# Patient Record
Sex: Female | Born: 1970 | Race: White | Hispanic: No | Marital: Married | Smoking: Never smoker
Health system: Southern US, Community
[De-identification: ages and names within clinical notes are randomized; demographics above are authoritative.]

## PROBLEM LIST (undated history)

## (undated) DIAGNOSIS — I10 Essential (primary) hypertension: Secondary | ICD-10-CM

## (undated) HISTORY — PX: NO PAST SURGERIES: SHX2092

---

## 2004-05-05 ENCOUNTER — Inpatient Hospital Stay: Payer: Self-pay | Admitting: Gynecology

## 2010-06-16 DIAGNOSIS — L719 Rosacea, unspecified: Secondary | ICD-10-CM | POA: Insufficient documentation

## 2010-06-16 DIAGNOSIS — E669 Obesity, unspecified: Secondary | ICD-10-CM | POA: Insufficient documentation

## 2010-12-19 ENCOUNTER — Ambulatory Visit: Payer: Self-pay

## 2012-04-30 ENCOUNTER — Ambulatory Visit: Payer: Self-pay

## 2012-04-30 LAB — RAPID INFLUENZA A&B ANTIGENS

## 2012-04-30 LAB — RAPID STREP-A WITH REFLX: Micro Text Report: POSITIVE

## 2012-07-05 ENCOUNTER — Ambulatory Visit: Payer: Self-pay

## 2013-07-08 DIAGNOSIS — I10 Essential (primary) hypertension: Secondary | ICD-10-CM | POA: Insufficient documentation

## 2013-07-31 ENCOUNTER — Ambulatory Visit: Payer: Self-pay | Admitting: Internal Medicine

## 2014-07-10 DIAGNOSIS — Z803 Family history of malignant neoplasm of breast: Secondary | ICD-10-CM | POA: Insufficient documentation

## 2015-01-22 ENCOUNTER — Encounter: Payer: Self-pay | Admitting: Emergency Medicine

## 2015-01-22 ENCOUNTER — Ambulatory Visit
Admission: EM | Admit: 2015-01-22 | Discharge: 2015-01-22 | Disposition: A | Discharge status: Home / Self Care | Payer: 59 | Attending: Family Medicine | Admitting: Family Medicine

## 2015-01-22 DIAGNOSIS — H6123 Impacted cerumen, bilateral: Secondary | ICD-10-CM | POA: Diagnosis not present

## 2015-01-22 DIAGNOSIS — H9202 Otalgia, left ear: Secondary | ICD-10-CM | POA: Diagnosis not present

## 2015-01-22 HISTORY — DX: Essential (primary) hypertension: I10

## 2015-01-22 NOTE — Discharge Instructions (Signed)
Cerumen Impaction The structures of the external ear canal secrete a waxy substance known as cerumen. Excess cerumen can build up in the ear canal, causing a condition known as cerumen impaction. Cerumen impaction can cause ear pain and disrupt the function of the ear. The rate of cerumen production differs for each individual. In certain individuals, the configuration of the ear canal may decrease his or her ability to naturally remove cerumen. CAUSES Cerumen impaction is caused by excessive cerumen production or buildup. RISK FACTORS  Frequent use of swabs to clean ears.  Having narrow ear canals.  Having eczema.  Being dehydrated. SIGNS AND SYMPTOMS  Diminished hearing.  Ear drainage.  Ear pain.  Ear itch. TREATMENT Treatment may involve:  Over-the-counter or prescription ear drops to soften the cerumen.  Removal of cerumen by a health care provider. This may be done with:  Irrigation with warm water. This is the most common method of removal.  Ear curettes and other instruments.  Surgery. This may be done in severe cases. HOME CARE INSTRUCTIONS  Take medicines only as directed by your health care provider.  Do not insert objects into the ear with the intent of cleaning the ear. PREVENTION  Do not insert objects into the ear, even with the intent of cleaning the ear. Removing cerumen as a part of normal hygiene is not necessary, and the use of swabs in the ear canal is not recommended.  Drink enough water to keep your urine clear or pale yellow.  Control your eczema if you have it. SEEK MEDICAL CARE IF:  You develop ear pain.  You develop bleeding from the ear.  The cerumen does not clear after you use ear drops as directed.   This information is not intended to replace advice given to you by your health care provider. Make sure you discuss any questions you have with your health care provider.   Document Released: 05/12/2004 Document Revised: 04/25/2014  Document Reviewed: 11/19/2014 Elsevier Interactive Patient Education 2016 Else[employer] Inc[employer]  Earache An earache, also called otalgia, can be caused by many things. Pain from an earache can be sharp, dull, or burning. The pain may be temporary or constant. Earaches can be caused by problems with the ear, such as infection in either the middle ear or the ear canal, injury, impacted ear wax, middle ear pressure, or a foreign body in the ear. Ear pain can also result from problems in other areas. This is called referred pain. For example, pain can come from a sore throat, a tooth infection, or problems with the jaw or the joint between the jaw and the skull (temporomandibular joint, or TMJ). The cause of an earache is not always easy to identify. Watchful waiting may be appropriate for some earaches until a clear cause of the pain can be found. HOME CARE INSTRUCTIONS Watch your condition for any changes. The following actions may help to lessen any discomfort that you are feeling:  Take medicines only as directed by your health care provider. This includes ear drops.  Apply ice to your outer ear to help reduce pain.  Put ice in a plastic bag.  Place a towel between your skin and the bag.  Leave the ice on for 20 minutes, 2-3 times per day.  Do not put anything in your ear other than medicine that is prescribed by your health care provider.  Try resting in an upright position instead of lying down. This may help to reduce pressure in the middle ear  and relieve pain.  Chew gum if it helps to relieve your ear pain.  Control any allergies that you have.  Keep all follow-up visits as directed by your health care provider. This is important. SEEK MEDICAL CARE IF:  Your pain does not improve within 2 days.  You have a fever.  You have new or worsening symptoms. SEEK IMMEDIATE MEDICAL CARE IF:  You have a severe headache.  You have a stiff neck.  You have difficulty swallowing.  You have  redness or swelling behind your ear.  You have drainage from your ear.  You have hearing loss.  You feel dizzy.   This information is not intended to replace advice given to you by your health care provider. Make sure you discuss any questions you have with your health care provider.   Document Released: 11/20/2003 Document Revised: 04/25/2014 Document Reviewed: 11/03/2013 Elsevier Interactive Patient Education [employer]

## 2015-01-22 NOTE — ED Notes (Signed)
Left ear pain since this morning

## 2015-01-22 NOTE — ED Provider Notes (Signed)
CSN: [id number]     Arrival date & time 01/22/15  1339 History   First MD Initiated Contact with Patient 01/22/15 1510     Chief Complaint  Patient presents with  . Otalgia   (Consider location/radiation/quality/duration/timing/severity/associated sxs/prior Treatment) Patient is a 44 y.o. female presenting with ear pain. The history is provided by the patient. No language interpreter was used.  Otalgia Location:  Left Quality:  Pressure and throbbing Severity:  Moderate Onset quality:  Sudden Duration:  6 hours Timing:  Constant Progression:  Worsening Context: elevation change   Context: not direct blow and not foreign body in ear   Relieved by:  Nothing Ineffective treatments:  None tried Associated symptoms: no congestion, no ear discharge, no fever and no sore throat   Associated symptoms comment:  She states she put a Q-tip in the left ear earlier today and the pain started after a Q-tip put in the ear. Risk factors: no recent travel, no chronic ear infection and no prior ear surgery     Past Medical History  Diagnosis Date  . Hypertension    Past Surgical History  Procedure Laterality Date  . No past surgeries     History reviewed. No pertinent family history. Social History  Substance Use Topics  . Smoking status: Never Smoker   . Smokeless tobacco: None  . Alcohol Use: No   OB History    No data available     Review of Systems  Constitutional: Negative for fever.  HENT: Positive for ear pain. Negative for congestion, ear discharge and sore throat.   All other systems reviewed and are negative.   Allergies  Review of patient's allergies indicates no known allergies.  Home Medications   Prior to Admission medications   Medication Sig Start Date End Date Taking? Authorizing Provider  amLODipine (NORVASC) 10 MG tablet Take 10 mg by mouth daily.   Yes Historical Provider, MD  hydrochlorothiazide (HYDRODIURIL) 25 MG tablet Take 25 mg by mouth daily.   Yes  Historical Provider, MD  metoprolol succinate (TOPROL-XL) 50 MG 24 hr tablet Take 50 mg by mouth 2 (two) times daily. Take with or immediately following a meal.   Yes Historical Provider, MD   Meds Ordered and Administered this Visit  Medications - No data to display  BP 141/89 mmHg  Pulse 71  Temp(Src) 98.4 F (36.9 C) (Tympanic)  Ht  (1.676 m)  Wt 188 lb (85.276 kg)  BMI 30.36 kg/m2  SpO2 100%  LMP 01/17/2015 No data found.   Physical Exam  Constitutional: She is oriented to person, place, and time. She appears well-nourished.  HENT:  Head: Normocephalic and atraumatic.  Right Ear: Hearing normal. A foreign body is present.  Left Ear: Hearing normal. A foreign body is present.  Nose: Nose normal.  Mouth/Throat: Uvula is midline and oropharynx is clear and moist.  Patient has marked amount wax in both ears. The right ear a little bit of the eardrum can be visualized in the left ear completely occluded with wax.  Eyes: Conjunctivae are normal. Pupils are equal, round, and reactive to light.  Neck: Neck supple.  Musculoskeletal: Normal range of motion.  Neurological: She is alert and oriented to person, place, and time.  Skin: Skin is warm.  Psychiatric: She has a normal mood and affect. Her behavior is normal.  Vitals reviewed.   ED Course  .Ear Cerumen Removal Date/Time: 01/22/2015 4:08 PM Performed by: Hassan Rowan Authorized by: Hassan Rowan Consent: Verbal  consent obtained. Local anesthetic: none Location details: left ear Procedure type: curette and irrigation Patient sedated: no Comments: After irrigation. Both eardrums were visible and patient felt much better.   (including critical care time)  Labs Review Labs Reviewed - No data to display  Imaging Review No results found.   Visual Acuity Review  Right Eye Distance:   Left Eye Distance:   Bilateral Distance:    Right Eye Near:   Left Eye Near:    Bilateral Near:         MDM   1.  Excessive ear wax, bilateral   2. Ear pain, left      Patient much improved after irrigation patient felt great both TMs now visible. Patient discharged home in good condition.  Hassan Rowan, MD 01/22/15 [phone]

## 2015-01-23 ENCOUNTER — Ambulatory Visit: Payer: Self-pay | Admitting: Physician Assistant

## 2015-07-07 DIAGNOSIS — L719 Rosacea, unspecified: Secondary | ICD-10-CM | POA: Diagnosis not present

## 2015-07-07 DIAGNOSIS — Z0000 Encounter for general adult medical examination without abnormal findings: Secondary | ICD-10-CM | POA: Diagnosis not present

## 2015-07-07 DIAGNOSIS — Z23 Encounter for immunization: Secondary | ICD-10-CM | POA: Diagnosis not present

## 2015-07-07 DIAGNOSIS — Z124 Encounter for screening for malignant neoplasm of cervix: Secondary | ICD-10-CM | POA: Diagnosis not present

## 2015-07-07 DIAGNOSIS — Z1239 Encounter for other screening for malignant neoplasm of breast: Secondary | ICD-10-CM | POA: Diagnosis not present

## 2015-07-07 DIAGNOSIS — I10 Essential (primary) hypertension: Secondary | ICD-10-CM | POA: Diagnosis not present

## 2016-06-28 DIAGNOSIS — Z0000 Encounter for general adult medical examination without abnormal findings: Secondary | ICD-10-CM | POA: Diagnosis not present

## 2016-06-28 DIAGNOSIS — Z1231 Encounter for screening mammogram for malignant neoplasm of breast: Secondary | ICD-10-CM | POA: Diagnosis not present

## 2016-06-28 DIAGNOSIS — H6123 Impacted cerumen, bilateral: Secondary | ICD-10-CM | POA: Diagnosis not present

## 2016-06-28 DIAGNOSIS — I10 Essential (primary) hypertension: Secondary | ICD-10-CM | POA: Diagnosis not present

## 2016-06-29 ENCOUNTER — Other Ambulatory Visit: Payer: Self-pay | Admitting: Internal Medicine

## 2016-06-29 DIAGNOSIS — Z1239 Encounter for other screening for malignant neoplasm of breast: Secondary | ICD-10-CM

## 2016-07-29 DIAGNOSIS — M545 Low back pain: Secondary | ICD-10-CM | POA: Diagnosis not present

## 2016-07-29 DIAGNOSIS — M6281 Muscle weakness (generalized): Secondary | ICD-10-CM | POA: Diagnosis not present

## 2016-08-10 DIAGNOSIS — M6281 Muscle weakness (generalized): Secondary | ICD-10-CM | POA: Diagnosis not present

## 2016-08-10 DIAGNOSIS — M545 Low back pain: Secondary | ICD-10-CM | POA: Diagnosis not present

## 2017-05-05 ENCOUNTER — Other Ambulatory Visit: Payer: Self-pay | Admitting: Internal Medicine

## 2017-05-05 ENCOUNTER — Ambulatory Visit
Admission: RE | Admit: 2017-05-05 | Discharge: 2017-05-05 | Disposition: A | Discharge status: Home / Self Care | Payer: 59 | Source: Ambulatory Visit | Attending: Internal Medicine | Admitting: Internal Medicine

## 2017-05-05 DIAGNOSIS — Z1239 Encounter for other screening for malignant neoplasm of breast: Secondary | ICD-10-CM

## 2017-05-05 DIAGNOSIS — Z1231 Encounter for screening mammogram for malignant neoplasm of breast: Secondary | ICD-10-CM | POA: Diagnosis not present

## 2017-06-30 DIAGNOSIS — E669 Obesity, unspecified: Secondary | ICD-10-CM | POA: Diagnosis not present

## 2017-06-30 DIAGNOSIS — Z13220 Encounter for screening for lipoid disorders: Secondary | ICD-10-CM | POA: Diagnosis not present

## 2017-06-30 DIAGNOSIS — Z131 Encounter for screening for diabetes mellitus: Secondary | ICD-10-CM | POA: Diagnosis not present

## 2017-06-30 DIAGNOSIS — I10 Essential (primary) hypertension: Secondary | ICD-10-CM | POA: Diagnosis not present

## 2017-06-30 DIAGNOSIS — H6123 Impacted cerumen, bilateral: Secondary | ICD-10-CM | POA: Diagnosis not present

## 2017-06-30 DIAGNOSIS — Z6831 Body mass index (BMI) 31.0-31.9, adult: Secondary | ICD-10-CM | POA: Diagnosis not present

## 2017-06-30 DIAGNOSIS — Z0000 Encounter for general adult medical examination without abnormal findings: Secondary | ICD-10-CM | POA: Diagnosis not present

## 2017-06-30 DIAGNOSIS — R5383 Other fatigue: Secondary | ICD-10-CM | POA: Diagnosis not present

## 2018-01-13 ENCOUNTER — Telehealth: Payer: 59 | Admitting: Physician Assistant

## 2018-01-13 DIAGNOSIS — R300 Dysuria: Secondary | ICD-10-CM | POA: Diagnosis not present

## 2018-01-13 MED ORDER — CEPHALEXIN 500 MG PO CAPS: 500.0000 mg | ORAL_CAPSULE | Freq: Two times a day (BID) | ORAL | 0 refills | Status: AC

## 2018-01-13 NOTE — Progress Notes (Signed)

## 2018-04-17 IMAGING — MG MM DIGITAL SCREENING BILAT W/ TOMO W/ CAD
8 of 13 series · 8 of 29 positions shown · non-contrast
Comparison: Previous exam(s).

CLINICAL DATA: Screening.

EXAM:
2D DIGITAL SCREENING BILATERAL MAMMOGRAM WITH 3D TOMO WITH CAD

[R MLO (1 of 2)]
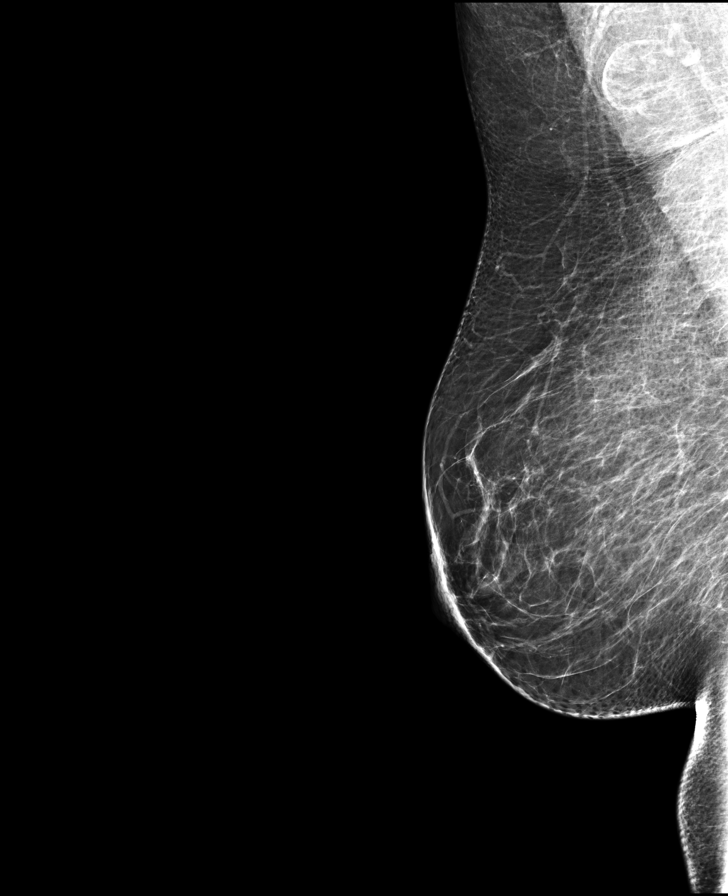

[R MLO (2 of 2)]
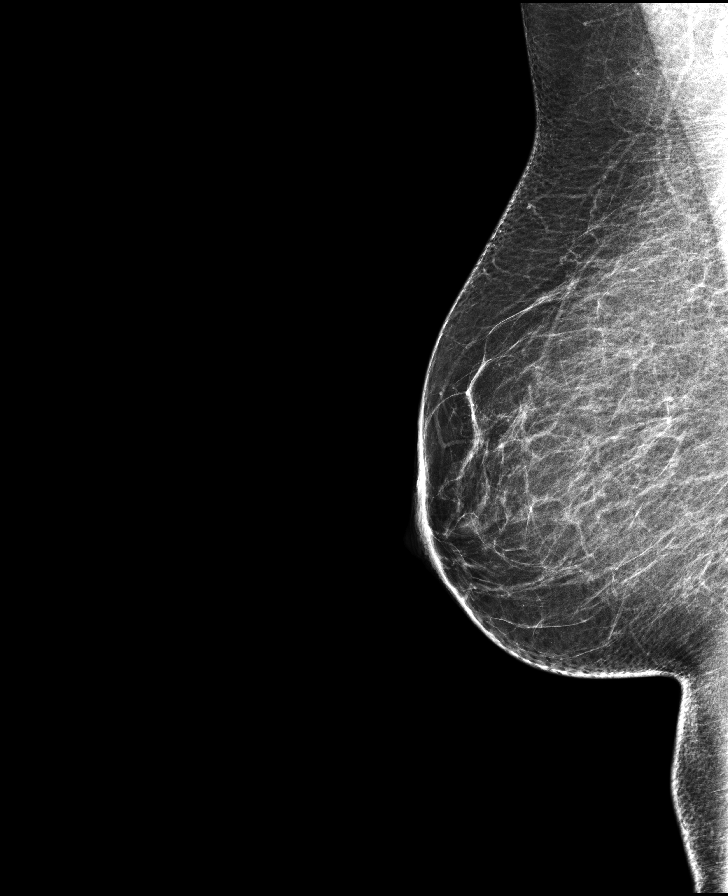

[L MLO synth-2D]
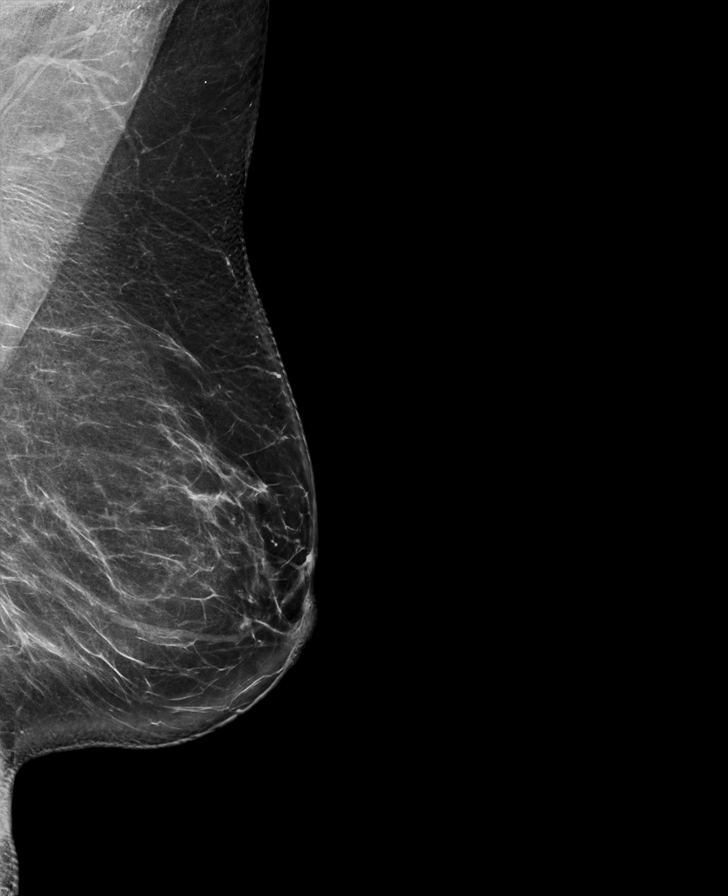

[R CC]
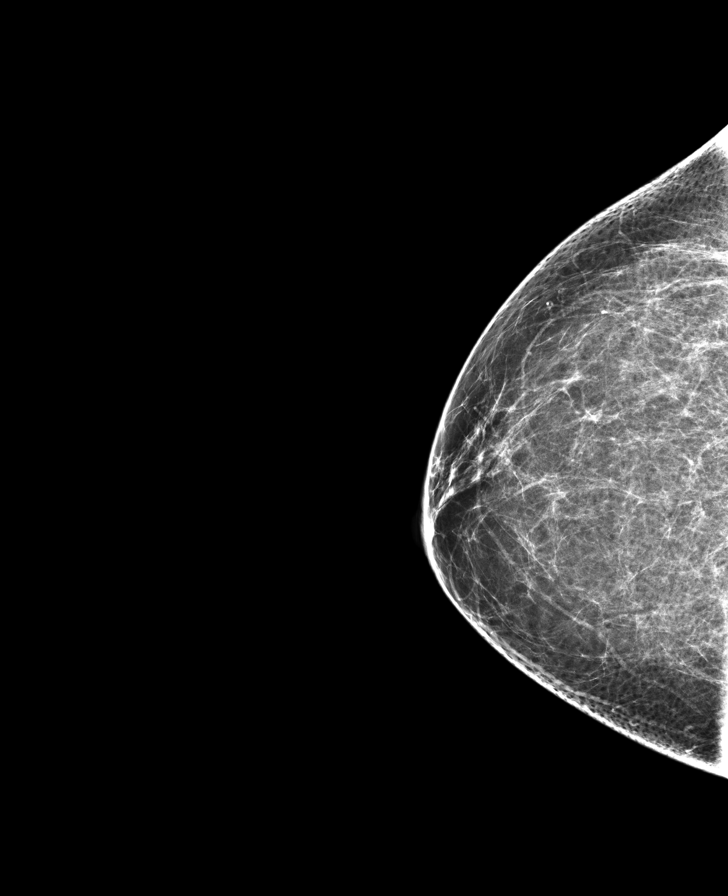

[R CC synth-2D]
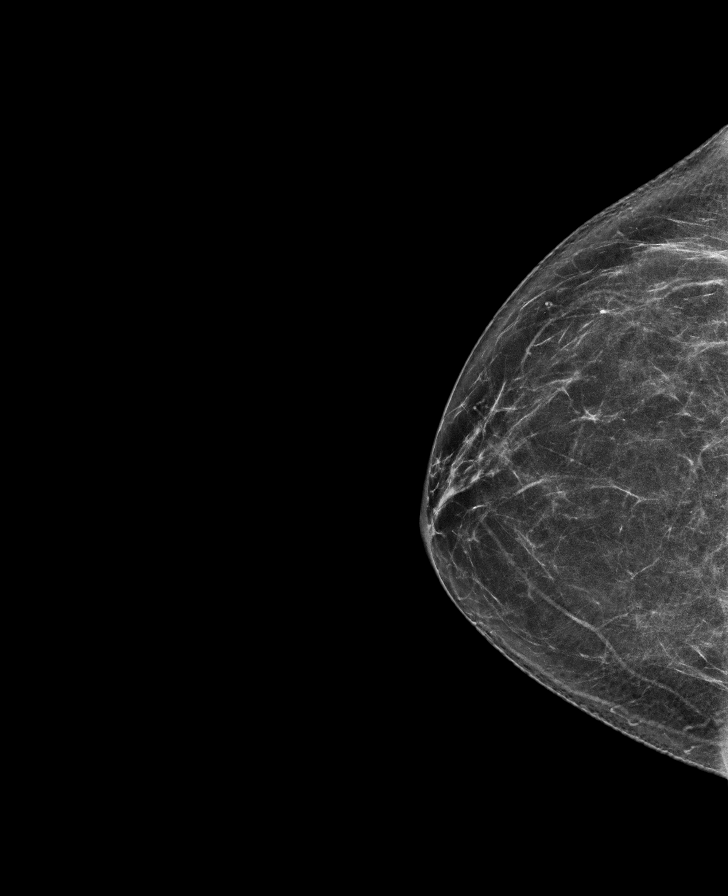

[L MLO]
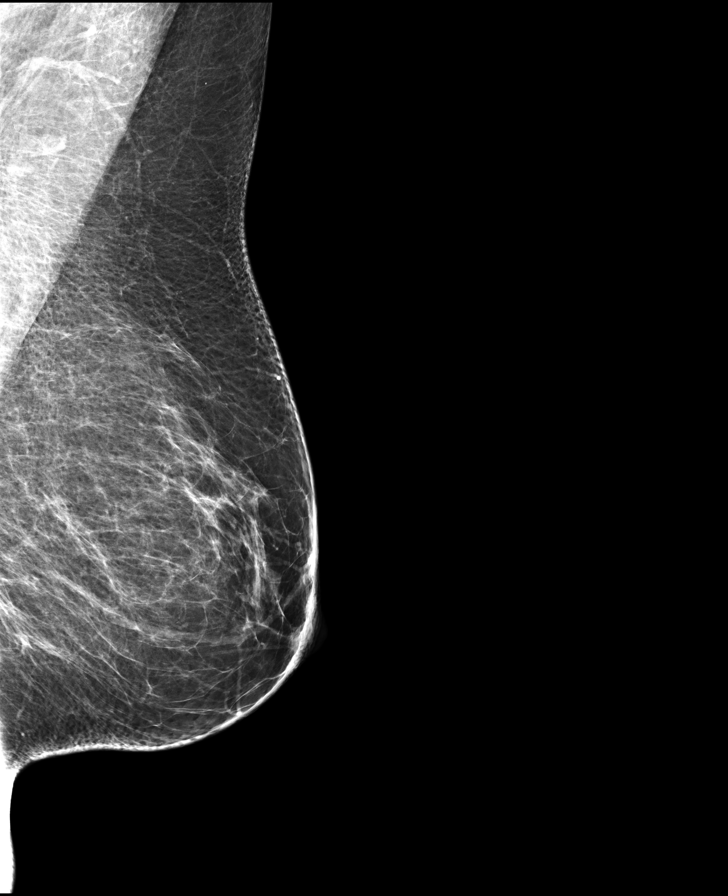

[R MLO synth-2D]
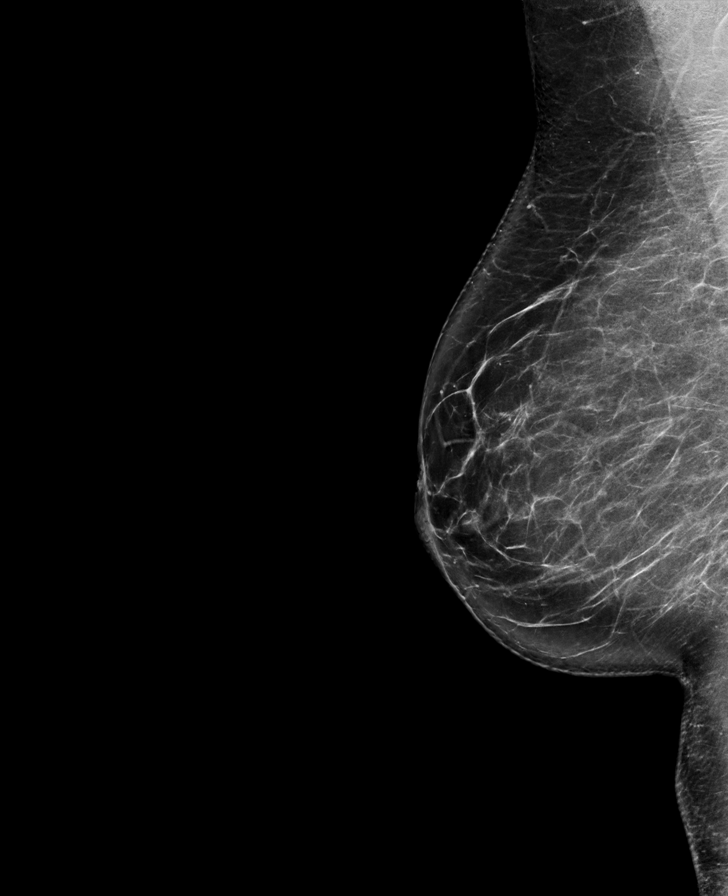

[L CC synth-2D]
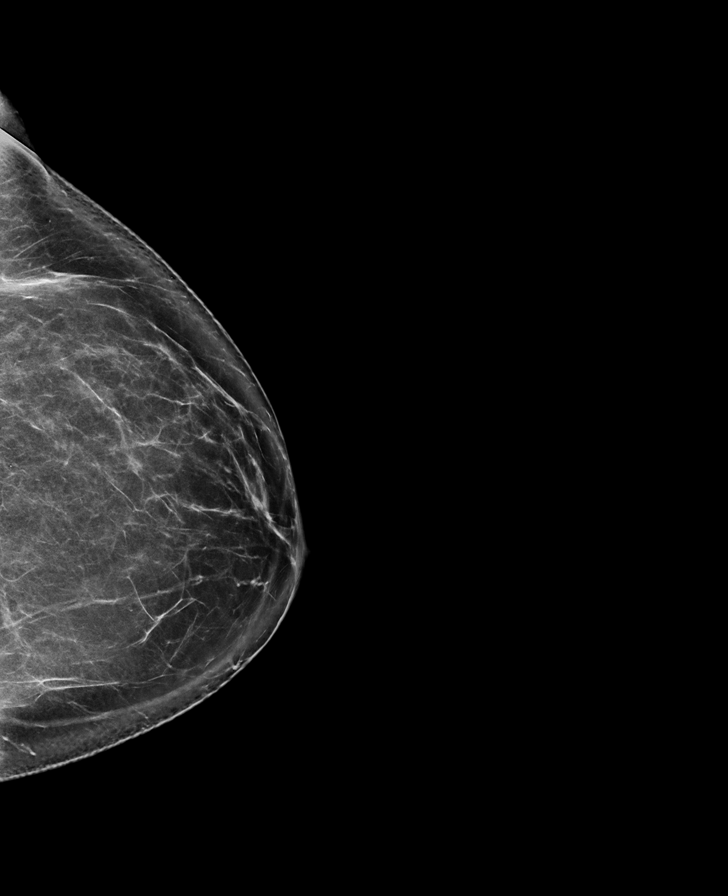

[8 of 29 positions shown; findings below may reference images not displayed]

ACR Breast Density Category b: There are scattered areas of
fibroglandular density.
FINDINGS: There are no findings suspicious for malignancy. Images were
processed with CAD.
IMPRESSION: No mammographic evidence of malignancy. A result letter of this
screening mammogram will be mailed directly to the patient.

RECOMMENDATION:
Screening mammogram in one year. (Code:[id number]-P-ZS0)

BI-RADS CATEGORY  1: Negative.

## 2018-06-05 ENCOUNTER — Ambulatory Visit: Payer: Self-pay | Admitting: Physician Assistant

## 2018-06-05 ENCOUNTER — Encounter: Payer: Self-pay | Admitting: Physician Assistant

## 2018-06-05 ENCOUNTER — Other Ambulatory Visit: Payer: Self-pay | Admitting: Physician Assistant

## 2018-06-05 VITALS — BP 142/90 | HR 79 | Temp 98.0°F | Resp 16 | Ht 65.0 in | Wt 200.0 lb

## 2018-06-05 DIAGNOSIS — H9202 Otalgia, left ear: Secondary | ICD-10-CM

## 2018-06-05 MED ORDER — PSEUDOEPHEDRINE HCL ER 120 MG PO TB12: 120.0000 mg | ORAL_TABLET | Freq: Two times a day (BID) | ORAL | 0 refills | Status: AC

## 2018-06-05 MED ORDER — FLUTICASONE PROPIONATE 50 MCG/ACT NA SUSP: 2.0000 | Freq: Every day | NASAL | 0 refills | Status: AC

## 2018-06-05 MED ORDER — CETIRIZINE HCL 10 MG PO TABS: 10.0000 mg | ORAL_TABLET | Freq: Every day | ORAL | 0 refills | Status: AC

## 2018-06-05 MED ORDER — CIPROFLOXACIN-DEXAMETHASONE 0.3-0.1 % OT SUSP: 4.0000 [drp] | Freq: Two times a day (BID) | OTIC | 0 refills | Status: AC

## 2018-06-05 NOTE — Progress Notes (Signed)
Patient ID: Sydney Lawrence DOB: 1971/02/18 AGE: 48 y.o. MRN: [id number]   PCP: No primary care provider on file.   Chief Complaint:  Chief Complaint  Patient presents with  . Otitis Media    x2d     Subjective:    HPI:  Sydney Lawrence is a 48 y.o. female presents for evaluation  Chief Complaint  Patient presents with  . Otitis Media    x38d    48 year old female presents to [facility] with three day history of left ear pain. Began Sunday 06/03/2018 in the afternoon. Initially mild. Gradually worsened. Moderate and constant today. Describes as feeling like previous history of otitis media (has not had episode since she was a young child). No previous history of tympanostomy procedure. Associated very mild rhinorrhea. Has taken OTC tylenol with minimal symptom relief. Yesterday evening had sweats; unsure if fever. Denies fever via thermometer, headache, ear discharge/drainage, popping/crackling, muffled hearing, tinnitus, foreign body sensation in ear, dizziness, sinus pain, sore throat, chest pain, palpitations, SOB, wheezing, cough.  Patient denies use of Q-tips; since episode in 2016 requiring bilateral ear irrigation for cerumen impaction. Patient states typically once a year her PCP performs bilateral ear irrigation; next appointment next month. Patient denies use of hearing aides or ear plugs. No recent swimming. Previous history of otitis externa, states current symptoms do not feel similar.  Denies dental pain or pain with mastication. Sees her dentist twice a year. No history of dental carie or dental abscess.  A limited review of symptoms was performed, pertinent positives and negatives as mentioned in HPI.  The following portions of the patient's history were reviewed and updated as appropriate: allergies, current medications and past medical history.  Patient Active Problem List   Diagnosis Date Noted  . Family history of breast cancer in mother  07/10/2014  . Hypertension 07/08/2013  . Class 1 obesity without serious comorbidity in adult 06/16/2010  . Rosacea 06/16/2010    No Known Allergies  Current Outpatient Medications on File Prior to Visit  Medication Sig Dispense Refill  . amLODipine (NORVASC) 10 MG tablet Take 10 mg by mouth daily.    . hydrochlorothiazide (HYDRODIURIL) 25 MG tablet Take 25 mg by mouth daily.    . metoprolol succinate (TOPROL-XL) 50 MG 24 hr tablet Take 50 mg by mouth 2 (two) times daily. Take with or immediately following a meal.     No current facility-administered medications on file prior to visit.        Objective:   Vitals:   06/05/18 1530  BP: (!) 142/90  Pulse: 79  Resp: 16  Temp: 98 F (36.7 C)  SpO2: 98%     Wt Readings from Last 3 Encounters:  06/05/18 200 lb (90.7 kg)  01/22/15 188 lb (85.3 kg)    Physical Exam:   General Appearance:  Patient sitting comfortably on examination table. Conversational. Peri [place] self-historian. In no acute distress. Afebrile.   Head:  Normocephalic, without obvious abnormality, atraumatic  Eyes:  PERRL, conjunctiva/corneas clear, EOM's intact  Ears:  Right ear canal with 25% cerumen impaction, lower portion. Canal reveals no edema or erythema. Right TM pearly, shiny, good light reflex, visible landmarks. No erythema or injection. No bulging or retraction. Left ear canal with 25% cerumen impaction, lower portion. Canal with no edema or erythema. Pain with palpation over tragus, manipulation of auricle, and insertion of otoscope. No purulent drainage/discharge. No visible open wound. Left TM visualized; pearly, shiny, good light reflex. No  erythema or injection. No bulging or retraction.  Left mastoid with no edema or erythema. No tenderness with palpation over left mastoid.  Nose: Nares normal, septum midline. Scant clear rhinorrhea. Normal mucosa. No sinus tenderness with percussion/palpation.  Throat: Lips, mucosa, and tongue normal; teeth and gums  normal. Dentition in good condition. No visible caries. No evidence of previous caps/root canals. No fractured/broken tooth. Gums healthy; no edema, erythema, purulent drainage, or bleeding. Throat reveals no erythema. Tonsils with no enlargement or exudate. No trismus. No pain with palpation over left TMJ.  Neck: Supple, symmetrical, trachea midline, no adenopathy  Lungs:   Clear to auscultation bilaterally, respirations unlabored  Heart:  Regular rate and rhythm, S1 and S2 normal, no murmur, rub, or gallop  Extremities: Extremities normal, atraumatic, no cyanosis or edema  Pulses: 2+ and symmetric  Skin: Skin color, texture, turgor normal, no rashes or lesions  Lymph nodes: Cervical, supraclavicular, and axillary nodes normal  Neurologic: Normal    Assessment & Plan:    Exam findings, diagnosis etiology and medication use and indications reviewed with patient. Follow-Up and discharge instructions provided. No emergent/urgent issues found on exam.  Patient education was provided.   Patient verbalized understanding of information provided and agrees with plan of care (POC), all questions answered. The patient is advised to call or return to clinic if condition does not see an improvement in symptoms, or to seek the care of the closest emergency department if condition worsens with the below plan.    1. Acute otalgia, left  - fluticasone (FLONASE) 50 MCG/ACT nasal spray; Place 2 sprays into both nostrils daily.  Dispense: 16 g; Refill: 0 - cetirizine (ZYRTEC) 10 MG tablet; Take 1 tablet (10 mg total) by mouth daily.  Dispense: 14 tablet; Refill: 0 - pseudoephedrine (SUDAFED 12 HOUR) 120 MG 12 hr tablet; Take 1 tablet (120 mg total) by mouth 2 (two) times daily for 7 days.  Dispense: 14 tablet; Refill: 0 - ciprofloxacin-dexamethasone (CIPRODEX) OTIC suspension; Place 4 drops into the left ear 2 (two) times daily for 7 days.  Dispense: 7.5 mL; Refill: 0  Patient with three day history of left  ear pain. Etiology unclear. Left TM without erythema, injection, bulging/retraction. Left ear canal without cerumen occlusion; moderate amount of soft cerumen. Tenderness with palpation over left tragus, with manipulation of left auricle, and with insertion of otoscope suggests otitis externa. Prescribed CiproDex. Discussed possibility of serous effusion; prescribed Zyrtec, Sudafed, and Flonase nasal spray.  Discussed with patient, left ear pain could be due to prodromal phase of herpes zoster, dental abscess/infection, TMJ, trigeminal neuralgia, etc. Advised close follow-up. If not improvement in symptoms in 2-3 days, ear should be re-evaluated by PCP, urgent care, or [facility]. If still no improvement, will most likely schedule appointment with ENT for further evaluation.   Janalyn Harder, MHS, PA-C Rulon Sera, MHS, PA-C Advanced Practice Provider [facility]  [address] [facility], [address] [place], [place] [address] (p):  [phone] [email or web address]

## 2018-06-05 NOTE — Patient Instructions (Addendum)
Thank you for choosing [facility] for your health care needs.  You have been diagnosed with acute left otalgia (ear pain).  Take medications as prescribed: Meds ordered this encounter  Medications  . fluticasone (FLONASE) 50 MCG/ACT nasal spray    Sig: Place 2 sprays into both nostrils daily.    Dispense:  16 g    Refill:  0    Order Specific Question:   Supervising Provider    Answer:   MILLER, BRIAN [[id number]]  . cetirizine (ZYRTEC) 10 MG tablet    Sig: Take 1 tablet (10 mg total) by mouth daily.    Dispense:  14 tablet    Refill:  0    Order Specific Question:   Supervising Provider    Answer:   MILLER, BRIAN [[id number]]  . pseudoephedrine (SUDAFED 12 HOUR) 120 MG 12 hr tablet    Sig: Take 1 tablet (120 mg total) by mouth 2 (two) times daily for 7 days.    Dispense:  14 tablet    Refill:  0    Order Specific Question:   Supervising Provider    Answer:   MILLER, BRIAN [[id number]]  . ciprofloxacin-dexamethasone (CIPRODEX) OTIC suspension    Sig: Place 4 drops into the left ear 2 (two) times daily for 7 days.    Dispense:  7.5 mL    Refill:  0    Order Specific Question:   Supervising Provider    Answer:   Hyacinth Meeker, BRIAN [[id number]]   Take antihistamine (Zyrtec) and decongestant (Sudafed). Use Flonase nasal spray.  CiproDex ear drops may provide some pain relief to ear canal.  Follow-up with family physician, urgent care, or at [facility] in 2-3 days for re-evaluation of ear. Follow-up sooner with any worsening symptoms.  Eustachian Tube Dysfunction  Eustachian tube dysfunction refers to a condition in which a blockage develops in the narrow passage that connects the middle ear to the back of the nose (eustachian tube). The eustachian tube regulates air pressure in the middle ear by letting air move between the ear and nose. It also helps to drain fluid from the middle ear space. Eustachian tube dysfunction can affect one or both ears. When the eustachian tube does not function properly, air  pressure, fluid, or both can build up in the middle ear. What are the causes? This condition occurs when the eustachian tube becomes blocked or cannot open normally. Common causes of this condition include:  Ear infections.  Colds and other infections that affect the nose, mouth, and throat (upper respiratory tract).  Allergies.  Irritation from cigarette smoke.  Irritation from stomach acid coming up into the esophagus (gastroesophageal reflux). The esophagus is the tube that carries food from the mouth to the stomach.  Sudden changes in air pressure, such as from descending in an airplane or scuba diving.  Abnormal growths in the nose or throat, such as: ? Growths that line the nose (nasal polyps). ? Abnormal growth of cells (tumors). ? Enlarged tissue at the back of the throat (adenoids). What increases the risk? You are more likely to develop this condition if:  You smoke.  You are overweight.  You are a child who has: ? Certain birth defects of the mouth, such as cleft palate. ? Large tonsils or adenoids. What are the signs or symptoms? Common symptoms of this condition include:  A feeling of fullness in the ear.  Ear pain.  Clicking or popping noises in the ear.  Ringing in the ear.  Hearing loss.  Loss of balance.  Dizziness. Symptoms may get worse when the air pressure around you changes, such as when you travel to an area of high elevation, fly on an airplane, or go scuba diving. How is this diagnosed? This condition may be diagnosed based on:  Your symptoms.  A physical exam of your ears, nose, and throat.  Tests, such as those that measure: ? The movement of your eardrum (tympanogram). ? Your hearing (audiometry). How is this treated? Treatment depends on the cause and severity of your condition.  In mild cases, you may relieve your symptoms by moving air into your ears. This is called "popping the ears."  In more severe cases, or if you have  symptoms of fluid in your ears, treatment may include: ? Medicines to relieve congestion (decongestants). ? Medicines that treat allergies (antihistamines). ? Nasal sprays or ear drops that contain medicines that reduce swelling (steroids). ? A procedure to drain the fluid in your eardrum (myringotomy). In this procedure, a small tube is placed in the eardrum to:  Drain the fluid.  Restore the air in the middle ear space. ? A procedure to insert a balloon device through the nose to inflate the opening of the eustachian tube (balloon dilation). Follow these instructions at home: Lifestyle  Do not do any of the following until your health care provider approves: ? Travel to high altitudes. ? Fly in airplanes. ? Work in a Estate agent or room. ? Scuba dive.  Do not use any products that contain nicotine or tobacco, such as cigarettes and e-cigarettes. If you need help quitting, ask your health care provider.  Keep your ears dry. Wear fitted earplugs during showering and bathing. Dry your ears completely after. General instructions  Take over-the-counter and prescription medicines only as told by your health care provider.  Use techniques to help pop your ears as recommended by your health care provider. These may include: ? Chewing gum. ? Yawning. ? Frequent, forceful swallowing. ? Closing your mouth, holding your nose closed, and gently blowing as if you are trying to blow air out of your nose.  Keep all follow-up visits as told by your health care provider. This is important. Contact a health care provider if:  Your symptoms do not go away after treatment.  Your symptoms come back after treatment.  You are unable to pop your ears.  You have: ? A fever. ? Pain in your ear. ? Pain in your head or neck. ? Fluid draining from your ear.  Your hearing suddenly changes.  You become very dizzy.  You lose your balance. Summary  Eustachian tube dysfunction refers to a  condition in which a blockage develops in the eustachian tube.  It can be caused by ear infections, allergies, inhaled irritants, or abnormal growths in the nose or throat.  Symptoms include ear pain, hearing loss, or ringing in the ears.  Mild cases are treated with maneuvers to unblock the ears, such as yawning or ear popping.  Severe cases are treated with medicines. Surgery may also be done (rare). This information is not intended to replace advice given to you by your health care provider. Make sure you discuss any questions you have with your health care provider. Document Released: 05/01/2015 Document Revised: 07/25/2017 Document Reviewed: 07/25/2017 Elsevier Interactive Patient Education  2019 [employer].

## 2018-06-07 ENCOUNTER — Telehealth: Payer: Self-pay | Admitting: Emergency Medicine

## 2018-06-07 NOTE — Telephone Encounter (Signed)
Left message following up on visit with [facility] 

## 2018-06-15 DIAGNOSIS — H9202 Otalgia, left ear: Secondary | ICD-10-CM | POA: Diagnosis not present

## 2019-07-12 ENCOUNTER — Other Ambulatory Visit: Payer: Self-pay | Admitting: Internal Medicine

## 2019-07-12 DIAGNOSIS — Z1159 Encounter for screening for other viral diseases: Secondary | ICD-10-CM | POA: Diagnosis not present

## 2019-07-12 DIAGNOSIS — H6123 Impacted cerumen, bilateral: Secondary | ICD-10-CM | POA: Diagnosis not present

## 2019-07-12 DIAGNOSIS — Z0000 Encounter for general adult medical examination without abnormal findings: Secondary | ICD-10-CM | POA: Diagnosis not present

## 2019-07-12 DIAGNOSIS — E6609 Other obesity due to excess calories: Secondary | ICD-10-CM | POA: Diagnosis not present

## 2019-07-12 DIAGNOSIS — Z1231 Encounter for screening mammogram for malignant neoplasm of breast: Secondary | ICD-10-CM | POA: Diagnosis not present

## 2019-07-12 DIAGNOSIS — Z13220 Encounter for screening for lipoid disorders: Secondary | ICD-10-CM | POA: Diagnosis not present

## 2019-07-12 DIAGNOSIS — I10 Essential (primary) hypertension: Secondary | ICD-10-CM | POA: Diagnosis not present

## 2019-07-12 DIAGNOSIS — Z6832 Body mass index (BMI) 32.0-32.9, adult: Secondary | ICD-10-CM | POA: Diagnosis not present

## 2019-07-18 DIAGNOSIS — H6123 Impacted cerumen, bilateral: Secondary | ICD-10-CM | POA: Diagnosis not present

## 2019-07-22 ENCOUNTER — Telehealth: Payer: 59 | Admitting: Family

## 2019-07-22 DIAGNOSIS — R399 Unspecified symptoms and signs involving the genitourinary system: Secondary | ICD-10-CM

## 2019-07-22 MED ORDER — CEPHALEXIN 500 MG PO CAPS: 500.0000 mg | ORAL_CAPSULE | Freq: Two times a day (BID) | ORAL | 0 refills | Status: DC

## 2019-07-22 NOTE — Progress Notes (Signed)
kefWe are sorry that you are not feeling well.  Here is how we plan to help!  Based on what you shared with me it looks like you most likely have a simple urinary tract infection.  A UTI (Urinary Tract Infection) is a bacterial infection of the bladder.  Most cases of urinary tract infections are simple to treat but a key part of your care is to encourage you to drink plenty of fluids and watch your symptoms carefully.  I have prescribed Keflex 500 mg twice a day for 7 days.  Your symptoms should gradually improve. Call us if the burning in your urine worsens, you develop worsening fever, back pain or pelvic pain or if your symptoms do not resolve after completing the antibiotic.  Urinary tract infections can be prevented by drinking plenty of water to keep your body hydrated.  Also be sure when you wipe, wipe from front to back and don't hold it in!  If possible, empty your bladder every 4 hours.  Your e-visit answers were reviewed by a board certified advanced clinical practitioner to complete your personal care plan.  Depending on the condition, your plan could have included both over the counter or prescription medications.  If there is a problem please reply  once you have received a response from your provider.  Your safety is important to [place].  If you have drug allergies check your prescription carefully.    You can use MyChart to ask questions about today's visit, request a non-urgent call back, or ask for a work or school excuse for 24 hours related to this e-Visit. If it has been greater than 24 hours you will need to follow up with your provider, or enter a new e-Visit to address those concerns.   You will get an e-mail in the next two days asking about your experience.  I hope that your e-visit has been valuable and will speed your recovery. Thank you for using e-visits.   Approximately 5 minutes was spent documenting and reviewing patient's chart.

## 2020-05-04 ENCOUNTER — Telehealth: Payer: 59 | Admitting: Physician Assistant

## 2020-05-04 DIAGNOSIS — R399 Unspecified symptoms and signs involving the genitourinary system: Secondary | ICD-10-CM | POA: Diagnosis not present

## 2020-05-04 MED ORDER — CEPHALEXIN 500 MG PO CAPS: 500.0000 mg | ORAL_CAPSULE | Freq: Two times a day (BID) | ORAL | 0 refills | Status: AC

## 2020-05-04 NOTE — Progress Notes (Signed)

## 2020-08-14 DIAGNOSIS — Z0000 Encounter for general adult medical examination without abnormal findings: Secondary | ICD-10-CM | POA: Diagnosis not present

## 2020-08-14 DIAGNOSIS — E6609 Other obesity due to excess calories: Secondary | ICD-10-CM | POA: Diagnosis not present

## 2020-08-14 DIAGNOSIS — Z6832 Body mass index (BMI) 32.0-32.9, adult: Secondary | ICD-10-CM | POA: Diagnosis not present

## 2020-08-14 DIAGNOSIS — E7800 Pure hypercholesterolemia, unspecified: Secondary | ICD-10-CM | POA: Diagnosis not present

## 2020-08-14 DIAGNOSIS — I10 Essential (primary) hypertension: Secondary | ICD-10-CM | POA: Diagnosis not present

## 2020-08-14 DIAGNOSIS — Z131 Encounter for screening for diabetes mellitus: Secondary | ICD-10-CM | POA: Diagnosis not present

## 2020-08-14 DIAGNOSIS — Z1231 Encounter for screening mammogram for malignant neoplasm of breast: Secondary | ICD-10-CM | POA: Diagnosis not present

## 2021-05-14 ENCOUNTER — Other Ambulatory Visit: Payer: Self-pay | Admitting: Internal Medicine

## 2021-05-14 DIAGNOSIS — Z1231 Encounter for screening mammogram for malignant neoplasm of breast: Secondary | ICD-10-CM

## 2021-06-04 ENCOUNTER — Other Ambulatory Visit: Payer: Self-pay

## 2021-06-04 MED ORDER — AMLODIPINE BESYLATE 10 MG PO TABS
ORAL_TABLET | ORAL | 0 refills | Status: DC
  Filled 2021-06-04: qty 30, 30d supply, fill #0

## 2021-06-04 MED ORDER — HYDROCHLOROTHIAZIDE 25 MG PO TABS
ORAL_TABLET | ORAL | 0 refills | Status: DC
  Filled 2021-06-04: qty 30, 30d supply, fill #0

## 2021-06-04 MED ORDER — METOPROLOL TARTRATE 50 MG PO TABS
ORAL_TABLET | ORAL | 0 refills | Status: DC
  Filled 2021-06-04: qty 60, 30d supply, fill #0

## 2021-06-18 ENCOUNTER — Ambulatory Visit
Admission: RE | Admit: 2021-06-18 | Discharge: 2021-06-18 | Disposition: A | Discharge status: Home / Self Care | Payer: 59 | Source: Ambulatory Visit | Attending: Internal Medicine | Admitting: Internal Medicine

## 2021-06-18 ENCOUNTER — Other Ambulatory Visit: Payer: Self-pay

## 2021-06-18 DIAGNOSIS — Z1231 Encounter for screening mammogram for malignant neoplasm of breast: Secondary | ICD-10-CM | POA: Diagnosis not present

## 2021-08-20 ENCOUNTER — Other Ambulatory Visit: Payer: Self-pay

## 2021-08-20 DIAGNOSIS — Z124 Encounter for screening for malignant neoplasm of cervix: Secondary | ICD-10-CM | POA: Diagnosis not present

## 2021-08-20 DIAGNOSIS — Z1211 Encounter for screening for malignant neoplasm of colon: Secondary | ICD-10-CM | POA: Diagnosis not present

## 2021-08-20 DIAGNOSIS — E7800 Pure hypercholesterolemia, unspecified: Secondary | ICD-10-CM | POA: Diagnosis not present

## 2021-08-20 DIAGNOSIS — I10 Essential (primary) hypertension: Secondary | ICD-10-CM | POA: Diagnosis not present

## 2021-08-20 DIAGNOSIS — Z0000 Encounter for general adult medical examination without abnormal findings: Secondary | ICD-10-CM | POA: Diagnosis not present

## 2021-08-20 MED ORDER — METOPROLOL TARTRATE 50 MG PO TABS
ORAL_TABLET | ORAL | 3 refills | Status: DC
  Filled 2021-08-20: qty 180, 90d supply, fill #0
  Filled 2022-01-14: qty 180, 90d supply, fill #1
  Filled 2022-05-10: qty 180, 90d supply, fill #2
  Filled 2022-08-09: qty 180, 90d supply, fill #3

## 2021-08-20 MED ORDER — AMLODIPINE BESYLATE 10 MG PO TABS
ORAL_TABLET | ORAL | 3 refills | Status: DC
  Filled 2021-08-20: qty 90, 90d supply, fill #0
  Filled 2022-01-14: qty 90, 90d supply, fill #1
  Filled 2022-05-10: qty 90, 90d supply, fill #2
  Filled 2022-08-09: qty 90, 90d supply, fill #3

## 2021-08-20 MED ORDER — HYDROCHLOROTHIAZIDE 25 MG PO TABS
ORAL_TABLET | ORAL | 3 refills | Status: DC
  Filled 2021-08-20: qty 90, 90d supply, fill #0
  Filled 2022-01-14: qty 90, 90d supply, fill #1
  Filled 2022-05-10: qty 90, 90d supply, fill #2
  Filled 2022-08-09: qty 90, 90d supply, fill #3

## 2022-01-14 ENCOUNTER — Other Ambulatory Visit: Payer: Self-pay

## 2022-01-18 ENCOUNTER — Other Ambulatory Visit: Payer: Self-pay

## 2022-05-10 ENCOUNTER — Other Ambulatory Visit: Payer: Self-pay | Admitting: Internal Medicine

## 2022-05-10 DIAGNOSIS — Z1231 Encounter for screening mammogram for malignant neoplasm of breast: Secondary | ICD-10-CM

## 2022-06-24 ENCOUNTER — Ambulatory Visit
Admission: RE | Admit: 2022-06-24 | Discharge: 2022-06-24 | Disposition: A | Discharge status: Home / Self Care | Payer: 59 | Source: Ambulatory Visit | Attending: Internal Medicine | Admitting: Internal Medicine

## 2022-06-24 DIAGNOSIS — Z1231 Encounter for screening mammogram for malignant neoplasm of breast: Secondary | ICD-10-CM | POA: Insufficient documentation

## 2022-08-09 ENCOUNTER — Other Ambulatory Visit: Payer: Self-pay

## 2022-09-02 ENCOUNTER — Other Ambulatory Visit: Payer: Self-pay

## 2022-09-02 DIAGNOSIS — E7800 Pure hypercholesterolemia, unspecified: Secondary | ICD-10-CM | POA: Diagnosis not present

## 2022-09-02 DIAGNOSIS — I10 Essential (primary) hypertension: Secondary | ICD-10-CM | POA: Diagnosis not present

## 2022-09-02 DIAGNOSIS — S93401A Sprain of unspecified ligament of right ankle, initial encounter: Secondary | ICD-10-CM | POA: Diagnosis not present

## 2022-09-02 DIAGNOSIS — Z1211 Encounter for screening for malignant neoplasm of colon: Secondary | ICD-10-CM | POA: Diagnosis not present

## 2022-09-02 DIAGNOSIS — Z0000 Encounter for general adult medical examination without abnormal findings: Secondary | ICD-10-CM | POA: Diagnosis not present

## 2022-09-02 DIAGNOSIS — R739 Hyperglycemia, unspecified: Secondary | ICD-10-CM | POA: Diagnosis not present

## 2022-09-02 MED ORDER — METOPROLOL TARTRATE 50 MG PO TABS
50.0000 mg | ORAL_TABLET | Freq: Two times a day (BID) | ORAL | 3 refills | Status: DC
  Filled 2022-09-02 – 2022-12-05 (×3): qty 180, 90d supply, fill #0
  Filled 2023-02-24: qty 180, 90d supply, fill #1
  Filled 2023-06-20: qty 180, 90d supply, fill #2
  Filled 2023-08-28 – 2023-08-31 (×2): qty 180, 90d supply, fill #3

## 2022-09-02 MED ORDER — HYDROCHLOROTHIAZIDE 25 MG PO TABS
25.0000 mg | ORAL_TABLET | Freq: Every day | ORAL | 3 refills | Status: DC
  Filled 2022-09-02 – 2022-12-05 (×3): qty 90, 90d supply, fill #0
  Filled 2023-02-24: qty 90, 90d supply, fill #1
  Filled 2023-06-20: qty 90, 90d supply, fill #2
  Filled 2023-08-28 – 2023-08-31 (×2): qty 90, 90d supply, fill #3

## 2022-09-02 MED ORDER — AMLODIPINE BESYLATE 10 MG PO TABS
10.0000 mg | ORAL_TABLET | Freq: Every day | ORAL | 3 refills | Status: DC
  Filled 2022-09-02 – 2022-12-05 (×3): qty 90, 90d supply, fill #0
  Filled 2023-02-24: qty 90, 90d supply, fill #1
  Filled 2023-06-20: qty 90, 90d supply, fill #2
  Filled 2023-08-28 – 2023-08-31 (×2): qty 90, 90d supply, fill #3

## 2022-11-04 ENCOUNTER — Other Ambulatory Visit: Payer: Self-pay

## 2022-11-04 ENCOUNTER — Telehealth: Payer: 59 | Admitting: Physician Assistant

## 2022-11-04 DIAGNOSIS — R3989 Other symptoms and signs involving the genitourinary system: Secondary | ICD-10-CM | POA: Diagnosis not present

## 2022-11-04 MED ORDER — CEPHALEXIN 500 MG PO CAPS
500.0000 mg | ORAL_CAPSULE | Freq: Two times a day (BID) | ORAL | 0 refills | Status: AC
Start: 2022-11-04 — End: ?
  Filled 2022-11-04: qty 14, 7d supply, fill #0

## 2022-11-04 NOTE — Progress Notes (Signed)
I have spent 5 minutes in review of e-visit questionnaire, review and updating patient chart, medical decision making and response to patient.   William Cody Martin, PA-C    

## 2022-11-04 NOTE — Progress Notes (Signed)

## 2022-12-02 ENCOUNTER — Other Ambulatory Visit (HOSPITAL_COMMUNITY): Payer: Self-pay

## 2022-12-05 ENCOUNTER — Other Ambulatory Visit: Payer: Self-pay

## 2022-12-06 ENCOUNTER — Other Ambulatory Visit (HOSPITAL_COMMUNITY): Payer: Self-pay

## 2023-02-24 ENCOUNTER — Other Ambulatory Visit: Payer: Self-pay

## 2023-06-20 ENCOUNTER — Other Ambulatory Visit: Payer: Self-pay

## 2023-08-28 ENCOUNTER — Other Ambulatory Visit: Payer: Self-pay

## 2023-08-31 ENCOUNTER — Other Ambulatory Visit: Payer: Self-pay

## 2023-09-07 ENCOUNTER — Telehealth: Admitting: Nurse Practitioner

## 2023-09-07 ENCOUNTER — Other Ambulatory Visit: Payer: Self-pay

## 2023-09-07 DIAGNOSIS — R399 Unspecified symptoms and signs involving the genitourinary system: Secondary | ICD-10-CM

## 2023-09-07 MED ORDER — NITROFURANTOIN MONOHYD MACRO 100 MG PO CAPS
100.0000 mg | ORAL_CAPSULE | Freq: Two times a day (BID) | ORAL | 0 refills | Status: AC
Start: 2023-09-07 — End: 2023-09-13
  Filled 2023-09-07: qty 10, 5d supply, fill #0

## 2023-09-07 NOTE — Progress Notes (Signed)

## 2023-09-07 NOTE — Progress Notes (Signed)
 I have spent 5 minutes in review of e-visit questionnaire, review and updating patient chart, medical decision making and response to patient.   Claiborne Rigg, NP

## 2023-09-25 ENCOUNTER — Other Ambulatory Visit: Payer: Self-pay

## 2023-09-25 MED ORDER — HYDROCHLOROTHIAZIDE 25 MG PO TABS
25.0000 mg | ORAL_TABLET | Freq: Every day | ORAL | 3 refills | Status: AC
  Filled 2023-12-14: qty 90, 90d supply, fill #0
  Filled 2024-03-11: qty 90, 90d supply, fill #1

## 2023-09-25 MED ORDER — AMLODIPINE BESYLATE 10 MG PO TABS
10.0000 mg | ORAL_TABLET | Freq: Every day | ORAL | 3 refills | Status: AC
  Filled 2023-09-25 – 2023-12-14 (×2): qty 90, 90d supply, fill #0
  Filled 2024-03-11: qty 90, 90d supply, fill #1

## 2023-09-25 MED ORDER — METOPROLOL TARTRATE 50 MG PO TABS
50.0000 mg | ORAL_TABLET | Freq: Two times a day (BID) | ORAL | 3 refills | Status: AC
  Filled 2023-12-14: qty 180, 90d supply, fill #0
  Filled 2024-03-11: qty 180, 90d supply, fill #1

## 2023-12-14 ENCOUNTER — Other Ambulatory Visit: Payer: Self-pay

## 2023-12-14 ENCOUNTER — Other Ambulatory Visit: Payer: Self-pay | Admitting: Internal Medicine

## 2023-12-14 DIAGNOSIS — Z1231 Encounter for screening mammogram for malignant neoplasm of breast: Secondary | ICD-10-CM

## 2023-12-15 DIAGNOSIS — G43B0 Ophthalmoplegic migraine, not intractable: Secondary | ICD-10-CM | POA: Diagnosis not present

## 2023-12-15 DIAGNOSIS — Z1211 Encounter for screening for malignant neoplasm of colon: Secondary | ICD-10-CM | POA: Diagnosis not present

## 2023-12-15 DIAGNOSIS — I10 Essential (primary) hypertension: Secondary | ICD-10-CM | POA: Diagnosis not present

## 2023-12-15 DIAGNOSIS — M19042 Primary osteoarthritis, left hand: Secondary | ICD-10-CM | POA: Diagnosis not present

## 2023-12-15 DIAGNOSIS — E538 Deficiency of other specified B group vitamins: Secondary | ICD-10-CM | POA: Diagnosis not present

## 2023-12-15 DIAGNOSIS — M19041 Primary osteoarthritis, right hand: Secondary | ICD-10-CM | POA: Diagnosis not present

## 2023-12-15 DIAGNOSIS — Z0000 Encounter for general adult medical examination without abnormal findings: Secondary | ICD-10-CM | POA: Diagnosis not present

## 2023-12-15 DIAGNOSIS — Z13220 Encounter for screening for lipoid disorders: Secondary | ICD-10-CM | POA: Diagnosis not present

## 2023-12-15 DIAGNOSIS — R739 Hyperglycemia, unspecified: Secondary | ICD-10-CM | POA: Diagnosis not present

## 2023-12-15 DIAGNOSIS — Z1231 Encounter for screening mammogram for malignant neoplasm of breast: Secondary | ICD-10-CM | POA: Diagnosis not present

## 2024-01-19 ENCOUNTER — Ambulatory Visit
Admission: RE | Admit: 2024-01-19 | Discharge: 2024-01-19 | Disposition: A | Discharge status: Home / Self Care | Source: Ambulatory Visit | Attending: Internal Medicine | Admitting: Internal Medicine

## 2024-01-19 DIAGNOSIS — Z1231 Encounter for screening mammogram for malignant neoplasm of breast: Secondary | ICD-10-CM | POA: Insufficient documentation

## 2024-03-11 ENCOUNTER — Other Ambulatory Visit: Payer: Self-pay
